# Patient Record
Sex: Female | Born: 1995 | Race: Black or African American | Hispanic: No | Marital: Single | Smoking: Never smoker
Health system: Southern US, Community
[De-identification: ages and names within clinical notes are randomized; demographics above are authoritative.]

## PROBLEM LIST (undated history)

## (undated) DIAGNOSIS — Z9109 Other allergy status, other than to drugs and biological substances: Secondary | ICD-10-CM

## (undated) DIAGNOSIS — D573 Sickle-cell trait: Secondary | ICD-10-CM

## (undated) DIAGNOSIS — J45909 Unspecified asthma, uncomplicated: Secondary | ICD-10-CM

## (undated) HISTORY — PX: FRACTURE SURGERY: SHX138

---

## 2018-04-21 ENCOUNTER — Encounter (HOSPITAL_BASED_OUTPATIENT_CLINIC_OR_DEPARTMENT_OTHER): Payer: Self-pay | Admitting: *Deleted

## 2018-04-21 ENCOUNTER — Other Ambulatory Visit: Payer: Self-pay

## 2018-04-21 ENCOUNTER — Other Ambulatory Visit: Payer: Self-pay | Admitting: Orthopedic Surgery

## 2018-04-24 NOTE — H&P (Signed)
Melissa Andrade is an 23 y.o. female.   CC / Reason for Visit: Left thumb problem HPI: This patient is a 23 year old, right-hand-dominant, student who attends [employer] who slipped and fell while on campus and while holding a drink in her left hand.  She injured her left thumb.  [facility] where she was evaluated and asked to return to her primary care physician.  She was then referred here for further evaluation and treatment and was seen by Dr. Jerl Santos yesterday where new x-rays were taken and the patient was found to have an intra-articular distal phalanx fracture of the left thumb.  She has been utilizing a thumb spica splint and has been taking over-the-counter medications.  Past Medical History:  Diagnosis Date  . Asthma   . Environmental allergies   . Sickle cell trait [facility])     Past Surgical History:  Procedure Laterality Date  . FRACTURE SURGERY      History reviewed. No pertinent family history. Social History:  reports that she has never smoked. She has never used smokeless tobacco. She reports current alcohol use. She reports that she does not use drugs.  Allergies: No Known Allergies  No medications prior to admission.    No results found for this or any previous visit (from the past 48 hour(s)). No results found.  Review of Systems  All other systems reviewed and are negative.   Height 5\' 4"  (1.626 m), weight 81.6 kg, last menstrual period 04/01/2018. Physical Exam  Constitutional:  WD, WN, NAD HEENT:  NCAT, EOMI Neuro/Psych:  Alert & oriented to person, place, and time; appropriate mood & affect Lymphatic: No generalized UE edema or lymphadenopathy Extremities / MSK:  Both UE are normal with respect to appearance, ranges of motion, joint stability, muscle strength/tone, sensation, & perfusion except as otherwise noted:  The left thumb is swollen and ecchymotic around the IP joint.  Index through small finger have full range of motion.  MP  joint of left thumb is somewhat stiff from being in a full thumb spica splint.  NVI.  Labs / X-rays:  No radiographic studies obtained today.  2 views of the left thumb ordered and obtained yesterday demonstrate a closed, intra-articular, impacted, and somewhat dorsally angulated distal phalanx fracture.  Assessment: Left thumb distal phalanx fracture  Plan:  The scope of treatment for this particular problem was discussed with the patient including surgical and nonsurgical treatment.  The patient had concerns that this may indeed result in some type of early osteoarthritis and wishes to move forward with surgical intervention of the left thumb distal phalanx fracture. The details of the operative procedure were discussed with the patient.  Questions were invited and answered.  In addition to the goal of the procedure, the risks of the procedure to include but not limited to bleeding; infection; damage to the nerves or blood vessels that could result in bleeding, numbness, weakness, chronic pain, and the need for additional procedures; stiffness; the need for revision surgery; and anesthetic risks were reviewed.  No specific outcome was guaranteed or implied.  Informed consent was obtained.   Jodi Marble, MD 04/24/2018, 2:24 PM

## 2018-04-27 ENCOUNTER — Ambulatory Visit (HOSPITAL_BASED_OUTPATIENT_CLINIC_OR_DEPARTMENT_OTHER): Payer: Medicaid Other | Admitting: Certified Registered"

## 2018-04-27 ENCOUNTER — Ambulatory Visit (HOSPITAL_COMMUNITY): Payer: Medicaid Other

## 2018-04-27 ENCOUNTER — Encounter (HOSPITAL_BASED_OUTPATIENT_CLINIC_OR_DEPARTMENT_OTHER): Payer: Self-pay | Admitting: Certified Registered"

## 2018-04-27 ENCOUNTER — Other Ambulatory Visit: Payer: Self-pay

## 2018-04-27 ENCOUNTER — Ambulatory Visit (HOSPITAL_BASED_OUTPATIENT_CLINIC_OR_DEPARTMENT_OTHER)
Admission: RE | Admit: 2018-04-27 | Discharge: 2018-04-27 | Disposition: A | Discharge status: Home / Self Care | Payer: Medicaid Other | Attending: Orthopedic Surgery | Admitting: Orthopedic Surgery

## 2018-04-27 ENCOUNTER — Encounter (HOSPITAL_BASED_OUTPATIENT_CLINIC_OR_DEPARTMENT_OTHER)
Admission: RE | Disposition: A | Discharge status: Home / Self Care | Payer: Self-pay | Source: Home / Self Care | Attending: Orthopedic Surgery

## 2018-04-27 DIAGNOSIS — Y92214 College as the place of occurrence of the external cause: Secondary | ICD-10-CM | POA: Diagnosis not present

## 2018-04-27 DIAGNOSIS — D573 Sickle-cell trait: Secondary | ICD-10-CM | POA: Insufficient documentation

## 2018-04-27 DIAGNOSIS — Z419 Encounter for procedure for purposes other than remedying health state, unspecified: Secondary | ICD-10-CM

## 2018-04-27 DIAGNOSIS — J45909 Unspecified asthma, uncomplicated: Secondary | ICD-10-CM | POA: Diagnosis not present

## 2018-04-27 DIAGNOSIS — W010XXA Fall on same level from slipping, tripping and stumbling without subsequent striking against object, initial encounter: Secondary | ICD-10-CM | POA: Diagnosis not present

## 2018-04-27 DIAGNOSIS — S62522A Displaced fracture of distal phalanx of left thumb, initial encounter for closed fracture: Secondary | ICD-10-CM | POA: Diagnosis not present

## 2018-04-27 HISTORY — DX: Sickle-cell trait: D57.3

## 2018-04-27 HISTORY — PX: OPEN REDUCTION INTERNAL FIXATION (ORIF) DISTAL PHALANX: SHX6236

## 2018-04-27 HISTORY — DX: Unspecified asthma, uncomplicated: J45.909

## 2018-04-27 HISTORY — DX: Other allergy status, other than to drugs and biological substances: Z91.09

## 2018-04-27 SURGERY — OPEN REDUCTION INTERNAL FIXATION (ORIF) DISTAL PHALANX
Anesthesia: General | Site: Thumb | Laterality: Left

## 2018-04-27 MED ORDER — CEFAZOLIN SODIUM-DEXTROSE 2-4 GM/100ML-% IV SOLN
INTRAVENOUS | Status: AC
  Filled 2018-04-27: qty 100

## 2018-04-27 MED ORDER — LIDOCAINE 2% (20 MG/ML) 5 ML SYRINGE
INTRAMUSCULAR | Status: AC
  Filled 2018-04-27: qty 20

## 2018-04-27 MED ORDER — PROPOFOL 500 MG/50ML IV EMUL
INTRAVENOUS | Status: AC
  Filled 2018-04-27: qty 150

## 2018-04-27 MED ORDER — PROPOFOL 10 MG/ML IV BOLUS
INTRAVENOUS | Status: DC | PRN
  Administered 2018-04-27: 200 mg via INTRAVENOUS

## 2018-04-27 MED ORDER — IBUPROFEN 200 MG PO TABS: 600.0000 mg | ORAL_TABLET | Freq: Four times a day (QID) | ORAL | 0 refills | Status: AC

## 2018-04-27 MED ORDER — FENTANYL CITRATE (PF) 100 MCG/2ML IJ SOLN
25.0000 ug | INTRAMUSCULAR | Status: DC | PRN
  Administered 2018-04-27: 50 ug via INTRAVENOUS

## 2018-04-27 MED ORDER — FENTANYL CITRATE (PF) 100 MCG/2ML IJ SOLN
INTRAMUSCULAR | Status: AC
  Filled 2018-04-27: qty 2

## 2018-04-27 MED ORDER — EPHEDRINE 5 MG/ML INJ
INTRAVENOUS | Status: AC
  Filled 2018-04-27: qty 20

## 2018-04-27 MED ORDER — CEFAZOLIN SODIUM-DEXTROSE 2-4 GM/100ML-% IV SOLN
2.0000 g | INTRAVENOUS | Status: AC
  Administered 2018-04-27: 2 g via INTRAVENOUS

## 2018-04-27 MED ORDER — ACETAMINOPHEN 325 MG PO TABS: 650.0000 mg | ORAL_TABLET | Freq: Four times a day (QID) | ORAL | Status: AC

## 2018-04-27 MED ORDER — BUPIVACAINE-EPINEPHRINE 0.25% -1:200000 IJ SOLN
INTRAMUSCULAR | Status: DC | PRN
  Administered 2018-04-27: 8 mL

## 2018-04-27 MED ORDER — FENTANYL CITRATE (PF) 100 MCG/2ML IJ SOLN
50.0000 ug | INTRAMUSCULAR | Status: DC | PRN
  Administered 2018-04-27: 100 ug via INTRAVENOUS

## 2018-04-27 MED ORDER — ONDANSETRON HCL 4 MG/2ML IJ SOLN
INTRAMUSCULAR | Status: AC
  Filled 2018-04-27: qty 12

## 2018-04-27 MED ORDER — MIDAZOLAM HCL 2 MG/2ML IJ SOLN
INTRAMUSCULAR | Status: AC
  Filled 2018-04-27: qty 2

## 2018-04-27 MED ORDER — LIDOCAINE HCL (CARDIAC) PF 100 MG/5ML IV SOSY
PREFILLED_SYRINGE | INTRAVENOUS | Status: DC | PRN
  Administered 2018-04-27: 80 mg via INTRAVENOUS

## 2018-04-27 MED ORDER — LACTATED RINGERS IV SOLN
INTRAVENOUS | Status: DC
  Administered 2018-04-27 (×2): via INTRAVENOUS

## 2018-04-27 MED ORDER — MIDAZOLAM HCL 2 MG/2ML IJ SOLN
1.0000 mg | INTRAMUSCULAR | Status: DC | PRN
  Administered 2018-04-27: 2 mg via INTRAVENOUS

## 2018-04-27 MED ORDER — LACTATED RINGERS IV SOLN: INTRAVENOUS | Status: DC

## 2018-04-27 MED ORDER — SCOPOLAMINE 1 MG/3DAYS TD PT72: 1.0000 | MEDICATED_PATCH | Freq: Once | TRANSDERMAL | Status: DC | PRN

## 2018-04-27 MED ORDER — DEXAMETHASONE SODIUM PHOSPHATE 10 MG/ML IJ SOLN
INTRAMUSCULAR | Status: DC | PRN
  Administered 2018-04-27: 10 mg via INTRAVENOUS

## 2018-04-27 MED ORDER — DEXAMETHASONE SODIUM PHOSPHATE 10 MG/ML IJ SOLN
INTRAMUSCULAR | Status: AC
  Filled 2018-04-27: qty 3

## 2018-04-27 MED ORDER — EPHEDRINE 5 MG/ML INJ
INTRAVENOUS | Status: AC
  Filled 2018-04-27: qty 10

## 2018-04-27 MED ORDER — OXYCODONE HCL 5 MG PO TABS: 5.0000 mg | ORAL_TABLET | Freq: Four times a day (QID) | ORAL | 0 refills | Status: AC | PRN

## 2018-04-27 SURGICAL SUPPLY — 59 items
BANDAGE COBAN STERILE 2 (GAUZE/BANDAGES/DRESSINGS) IMPLANT
BLADE MINI RND TIP GREEN BEAV (BLADE) IMPLANT
BLADE SURG 15 STRL LF DISP TIS (BLADE) ×1 IMPLANT
BLADE SURG 15 STRL SS (BLADE) ×2
BNDG COHESIVE 1X5 TAN STRL LF (GAUZE/BANDAGES/DRESSINGS) IMPLANT
BNDG COHESIVE 4X5 TAN STRL (GAUZE/BANDAGES/DRESSINGS) ×3 IMPLANT
BNDG CONFORM 2 STRL LF (GAUZE/BANDAGES/DRESSINGS) IMPLANT
BNDG ESMARK 4X9 LF (GAUZE/BANDAGES/DRESSINGS) ×3 IMPLANT
BNDG GAUZE ELAST 4 BULKY (GAUZE/BANDAGES/DRESSINGS) ×3 IMPLANT
CANISTER SUCT 1200ML W/VALVE (MISCELLANEOUS) ×3 IMPLANT
CHLORAPREP W/TINT 26ML (MISCELLANEOUS) ×3 IMPLANT
CORD BIPOLAR FORCEPS 12FT (ELECTRODE) ×3 IMPLANT
COVER BACK TABLE 60X90IN (DRAPES) ×3 IMPLANT
COVER MAYO STAND STRL (DRAPES) ×3 IMPLANT
COVER WAND RF STERILE (DRAPES) IMPLANT
CUFF TOURNIQUET SINGLE 18IN (TOURNIQUET CUFF) ×3 IMPLANT
DEPRESSOR TONGUE BLADE STERILE (MISCELLANEOUS) IMPLANT
DRAPE C-ARM 42X72 X-RAY (DRAPES) ×3 IMPLANT
DRAPE EXTREMITY T 121X128X90 (DISPOSABLE) ×3 IMPLANT
DRAPE SURG 17X23 STRL (DRAPES) ×3 IMPLANT
DRSG EMULSION OIL 3X3 NADH (GAUZE/BANDAGES/DRESSINGS) ×3 IMPLANT
GAUZE SPONGE 4X4 12PLY STRL LF (GAUZE/BANDAGES/DRESSINGS) ×3 IMPLANT
GAUZE XEROFORM 1X8 LF (GAUZE/BANDAGES/DRESSINGS) IMPLANT
GLOVE BIO SURGEON STRL SZ7.5 (GLOVE) ×3 IMPLANT
GLOVE BIOGEL PI IND STRL 7.0 (GLOVE) ×3 IMPLANT
GLOVE BIOGEL PI IND STRL 8 (GLOVE) ×1 IMPLANT
GLOVE BIOGEL PI INDICATOR 7.0 (GLOVE) ×6
GLOVE BIOGEL PI INDICATOR 8 (GLOVE) ×2
GLOVE ECLIPSE 6.5 STRL STRAW (GLOVE) ×6 IMPLANT
GOWN STRL REUS W/ TWL LRG LVL3 (GOWN DISPOSABLE) ×1 IMPLANT
GOWN STRL REUS W/ TWL XL LVL3 (GOWN DISPOSABLE) ×1 IMPLANT
GOWN STRL REUS W/TWL LRG LVL3 (GOWN DISPOSABLE) ×2
GOWN STRL REUS W/TWL XL LVL3 (GOWN DISPOSABLE) ×5 IMPLANT
K-WIRE .035X4 (WIRE) ×3 IMPLANT
K-WIRE .045X4 (WIRE) ×3 IMPLANT
NDL SAFETY ECLIPSE 18X1.5 (NEEDLE) ×2 IMPLANT
NEEDLE HYPO 18GX1.5 SHARP (NEEDLE) ×4
NEEDLE HYPO 22GX1.5 SAFETY (NEEDLE) ×3 IMPLANT
NS IRRIG 1000ML POUR BTL (IV SOLUTION) ×3 IMPLANT
PACK BASIN DAY SURGERY FS (CUSTOM PROCEDURE TRAY) ×3 IMPLANT
PADDING CAST ABS 4INX4YD NS (CAST SUPPLIES)
PADDING CAST ABS COTTON 4X4 ST (CAST SUPPLIES) IMPLANT
RUBBERBAND STERILE (MISCELLANEOUS) IMPLANT
SPLINT PLASTER CAST XFAST 3X15 (CAST SUPPLIES) ×8 IMPLANT
SPLINT PLASTER XTRA FASTSET 3X (CAST SUPPLIES) ×16
STOCKINETTE 6  STRL (DRAPES) ×2
STOCKINETTE 6 STRL (DRAPES) ×1 IMPLANT
SUCTION FRAZIER HANDLE 10FR (MISCELLANEOUS) ×2
SUCTION TUBE FRAZIER 10FR DISP (MISCELLANEOUS) ×1 IMPLANT
SUT CHROMIC 6 0 PS 4 (SUTURE) IMPLANT
SUT ETHILON 4 0 PS 2 18 (SUTURE) IMPLANT
SUT VICRYL RAPIDE 4-0 (SUTURE) IMPLANT
SUT VICRYL RAPIDE 4/0 PS 2 (SUTURE) ×3 IMPLANT
SYR 10ML LL (SYRINGE) ×3 IMPLANT
SYR BULB 3OZ (MISCELLANEOUS) ×3 IMPLANT
TOWEL GREEN STERILE FF (TOWEL DISPOSABLE) ×3 IMPLANT
TUBE CONNECTING 20'X1/4 (TUBING) ×1
TUBE CONNECTING 20X1/4 (TUBING) ×2 IMPLANT
UNDERPAD 30X30 (UNDERPADS AND DIAPERS) IMPLANT

## 2018-04-27 NOTE — Transfer of Care (Signed)
Immediate Anesthesia Transfer of Care Note  Patient: Contrina Harkin  Procedure(s) Performed: OPEN TREATMENT OF LEFT THUMB DISTAL PHALANX FRACTURE WITH PINNING (Left Thumb)  Patient Location: PACU  Anesthesia Type:General  Level of Consciousness: drowsy and patient cooperative  Airway & Oxygen Therapy: Patient Spontanous Breathing and Patient connected to face mask oxygen  Post-op Assessment: Report given to RN and Post -op Vital signs reviewed and stable  Post vital signs: Reviewed and stable  Last Vitals:  Vitals Value Taken Time  BP    Temp    Pulse 74 04/27/2018 10:08 AM  Resp    SpO2 99 % 04/27/2018 10:08 AM  Vitals shown include unvalidated device data.  Last Pain:  Vitals:   04/27/18 0857  TempSrc: Oral  PainSc: 0-No pain         Complications: No apparent anesthesia complications

## 2018-04-27 NOTE — Discharge Instructions (Addendum)
Discharge Instructions   You have a dressing with a plaster splint incorporated in it. Move your fingers as much as possible, making a full fist and fully opening the fist. Elevate your hand to reduce pain & swelling of the digits.  Ice over the operative site may be helpful to reduce pain & swelling.  DO NOT USE HEAT. Pain medicine has been prescribed for you.  Take Ibuprofen 600 mg and Tylenol 650 mg every 6 hours together. Take the pain medicine prescribed additionally as a rescue medicine for severe post operative pain. Leave the dressing in place until you return to our office.  You may shower, but keep the bandage clean & dry.  You may drive a car when you are off of prescription pain medications and can safely control your vehicle with both hands. Call our office and schedule an appointment for 7-10 days from the date of surgery.   Please call [phone] during normal business hours or [phone] after hours for any problems. Including the following:  - excessive redness of the incisions - drainage for more than 4 days - fever of more than 101.5 F  *Please note that pain medications will not be refilled after hours or on weekends.   Post Anesthesia Home Care Instructions  Activity: Get plenty of rest for the remainder of the day. A responsible individual must stay with you for 24 hours following the procedure.  For the next 24 hours, DO NOT: -Drive a car -Advertising copywriter -Drink alcoholic beverages -Take any medication unless instructed by your physician -Make any legal decisions or sign important papers.  Meals: Start with liquid foods such as gelatin or soup. Progress to regular foods as tolerated. Avoid greasy, spicy, heavy foods. If nausea and/or vomiting occur, drink only clear liquids until the nausea and/or vomiting subsides. Call your physician if vomiting continues.  Special Instructions/Symptoms: Your throat may feel dry or sore from the anesthesia or the  breathing tube placed in your throat during surgery. If this causes discomfort, gargle with warm salt water. The discomfort should disappear within 24 hours.  If you had a scopolamine patch placed behind your ear for the management of post- operative nausea and/or vomiting:  1. The medication in the patch is effective for 72 hours, after which it should be removed.  Wrap patch in a tissue and discard in the trash. Wash hands thoroughly with soap and water. 2. You may remove the patch earlier than 72 hours if you experience unpleasant side effects which may include dry mouth, dizziness or visual disturbances. 3. Avoid touching the patch. Wash your hands with soap and water after contact with the patch.

## 2018-04-27 NOTE — Anesthesia Procedure Notes (Signed)
Procedure Name: LMA Insertion Date/Time: 04/27/2018 9:18 AM Performed by: Endia Moncur D, CRNA Pre-anesthesia Checklist: Patient identified, Emergency Drugs available, Suction available and Patient being monitored Patient Re-evaluated:Patient Re-evaluated prior to induction Oxygen Delivery Method: Circle system utilized Preoxygenation: Pre-oxygenation with 100% oxygen Induction Type: IV induction Ventilation: Mask ventilation without difficulty LMA: LMA inserted LMA Size: 4.0 Number of attempts: 1 Airway Equipment and Method: Bite block Placement Confirmation: positive ETCO2 Tube secured with: Tape Dental Injury: Teeth and Oropharynx as per pre-operative assessment        

## 2018-04-27 NOTE — Anesthesia Procedure Notes (Signed)
Procedure Name: LMA Insertion Date/Time: 04/27/2018 9:18 AM Performed by: Sheryn BisonBlocker, Teletha Petrea D, CRNA Pre-anesthesia Checklist: Patient identified, Emergency Drugs available, Suction available and Patient being monitored Patient Re-evaluated:Patient Re-evaluated prior to induction Oxygen Delivery Method: Circle system utilized Preoxygenation: Pre-oxygenation with 100% oxygen Induction Type: IV induction Ventilation: Mask ventilation without difficulty LMA: LMA inserted LMA Size: 4.0 Number of attempts: 1 Airway Equipment and Method: Bite block Placement Confirmation: positive ETCO2 Tube secured with: Tape Dental Injury: Teeth and Oropharynx as per pre-operative assessment

## 2018-04-27 NOTE — Op Note (Signed)
04/27/2018  8:45 AM  PATIENT:  Melissa Andrade  22 y.o. female  PRE-OPERATIVE DIAGNOSIS:  Left thumb P2 displaced  intra-articular fx  POST-OPERATIVE DIAGNOSIS:  Same  PROCEDURE:  ORIF L thumb P2 intra-articular fx  SURGEON: Cliffton Astersavid A. Janee Mornhompson, MD  PHYSICIAN ASSISTANT: Danielle RankinKirsten Schrader, OPA-C  ANESTHESIA:  general  SPECIMENS:  None  DRAINS:   None  EBL:  less than 50 mL  PREOPERATIVE INDICATIONS:  Melissa EmeryJoynazia Heber is a  23 y.o. female with a displaced left thumb P2 intra-articular fx  The risks benefits and alternatives were discussed with the patient preoperatively including but not limited to the risks of infection, bleeding, nerve injury, cardiopulmonary complications, the need for revision surgery, among others, and the patient verbalized understanding and consented to proceed.  OPERATIVE IMPLANTS: 0.045 inch and 0.035 inch kwires  OPERATIVE PROCEDURE:  After receiving prophylactic antibiotics, the patient was escorted to the operative theatre and placed in a supine position. General anesthesia was administered.  A surgical "time-out" was performed during which the planned procedure, proposed operative site, and the correct patient identity were compared to the operative consent and agreement confirmed by the circulating nurse according to current facility policy.  Following application of a tourniquet to the operative extremity, the exposed skin was prepped with Chloraprep and draped in the usual sterile fashion.  A digital block was performed by me.  The limb was exsanguinated with an Esmarch bandage and the tourniquet inflated to approximately 100mmHg higher than systolic BP.  An H shaped incision was fashioned over the dorsal aspect of the IP joint of the thumb.  Proximal distal flaps were developed and elevated.  The displaced fracture fragment was actually 2.  They were flipped back to allow access to the joint.  Clot and debris was removed.  The bed was cleaned.  The  fragments were placed back into its native position and could be considered well reduced.  A 0.045 inch K wire was then driven from the proximal surface of P2 out the tip of the thumb.  The fragments were then reduced and held reduced as a K wire was advanced through the fragment into the head of the proximal phalanx.  Additionally, a 0.035 inch K wire was then driven obliquely through the distal ST flap, helping to keep the fracture fragments from displacing, and was driven into the head of the proximal phalanx, from dorsal distal to proximal volar.  2 of these were placed, one for the medial fragment and one for the lateral.   Final images were obtained.  The wound was irrigated, the tourniquet released and some additional hemostasis obtained with bipolar electrocautery.  The skin was closed with 4-0 Vicryl Rapide sutures and a bulky splint dressing was applied.  She was awakened and taken recovery in stable condition, breathing spontaneously.  DISPOSITION: She will be discharged home today with double instructions, returning in 7 to 10 days, with new x-rays of the left thumb out of splint and conversion to a hand-based thumb spica cast.

## 2018-04-27 NOTE — Interval H&P Note (Signed)
History and Physical Interval Note:  04/27/2018 8:45 AM  Melissa Andrade  has presented today for surgery, with the diagnosis of Left thumb distal phalanx fracture  The various methods of treatment have been discussed with the patient and family. After consideration of risks, benefits and other options for treatment, the patient has consented to  Procedure(s): OPEN TREATMENT OF LEFT THUMB DISTAL PHALANX FRACTURE (Left) as a surgical intervention .  The patient's history has been reviewed, patient examined, no change in status, stable for surgery.  I have reviewed the patient's chart and labs.  Questions were answered to the patient's satisfaction.     Jodi Marbleavid A Queena Monrreal

## 2018-04-27 NOTE — Anesthesia Preprocedure Evaluation (Addendum)
Anesthesia Evaluation  Patient identified by MRN, date of birth, ID band Patient awake    Reviewed: Allergy & Precautions, NPO status , Patient's Chart, lab work & pertinent test results  Airway Mallampati: I  TM Distance: >3 FB Neck ROM: Full    Dental no notable dental hx. (+) Teeth Intact, Dental Advisory Given   Pulmonary asthma ,    Pulmonary exam normal breath sounds clear to auscultation       Cardiovascular negative cardio ROS Normal cardiovascular exam Rhythm:Regular Rate:Normal     Neuro/Psych negative neurological ROS  negative psych ROS   GI/Hepatic negative GI ROS, Neg liver ROS,   Endo/Other  negative endocrine ROS  Renal/GU negative Renal ROS  negative genitourinary   Musculoskeletal negative musculoskeletal ROS (+)   Abdominal   Peds  Hematology negative hematology ROS (+) Sickle cell trait ,   Anesthesia Other Findings   Reproductive/Obstetrics                            Anesthesia Physical Anesthesia Plan  ASA: II  Anesthesia Plan: General   Post-op Pain Management:    Induction: Intravenous  PONV Risk Score and Plan: 3 and Ondansetron, Dexamethasone and Midazolam  Airway Management Planned: LMA  Additional Equipment:   Intra-op Plan:   Post-operative Plan: Extubation in OR  Informed Consent: I have reviewed the patients History and Physical, chart, labs and discussed the procedure including the risks, benefits and alternatives for the proposed anesthesia with the patient or authorized representative who has indicated his/her understanding and acceptance.     Dental advisory given  Plan Discussed with: CRNA  Anesthesia Plan Comments:         Anesthesia Quick Evaluation

## 2018-04-27 NOTE — Anesthesia Postprocedure Evaluation (Signed)
Anesthesia Post Note  Patient: Cherisa Littlepage  Procedure(s) Performed: OPEN TREATMENT OF LEFT THUMB DISTAL PHALANX FRACTURE WITH PINNING (Left Thumb)     Patient location during evaluation: PACU Anesthesia Type: General Level of consciousness: awake and alert Pain management: pain level controlled Vital Signs Assessment: post-procedure vital signs reviewed and stable Respiratory status: spontaneous breathing, nonlabored ventilation, respiratory function stable and patient connected to nasal cannula oxygen Cardiovascular status: blood pressure returned to baseline and stable Postop Assessment: no apparent nausea or vomiting Anesthetic complications: no    Last Vitals:  Vitals:   04/27/18 1045 04/27/18 1100  BP: 99/61 111/76  Pulse: 78 69  Resp: (!) 22 18  Temp:  36.5 C  SpO2: 99% 100%    Last Pain:  Vitals:   04/27/18 1100  TempSrc:   PainSc: 0-No pain                 Jadan Rouillard L Alexsia Klindt

## 2018-04-28 ENCOUNTER — Encounter (HOSPITAL_BASED_OUTPATIENT_CLINIC_OR_DEPARTMENT_OTHER): Payer: Self-pay | Admitting: Orthopedic Surgery

## 2018-06-09 ENCOUNTER — Encounter: Payer: Self-pay | Admitting: *Deleted

## 2018-06-09 ENCOUNTER — Other Ambulatory Visit: Payer: Self-pay

## 2018-06-09 ENCOUNTER — Ambulatory Visit: Payer: Medicaid Other | Attending: Orthopedic Surgery | Admitting: *Deleted

## 2018-06-09 DIAGNOSIS — M6281 Muscle weakness (generalized): Secondary | ICD-10-CM | POA: Insufficient documentation

## 2018-06-09 DIAGNOSIS — R278 Other lack of coordination: Secondary | ICD-10-CM | POA: Diagnosis present

## 2018-06-09 DIAGNOSIS — M25542 Pain in joints of left hand: Secondary | ICD-10-CM | POA: Diagnosis not present

## 2018-06-09 DIAGNOSIS — M25642 Stiffness of left hand, not elsewhere classified: Secondary | ICD-10-CM | POA: Diagnosis present

## 2018-06-09 DIAGNOSIS — M79642 Pain in left hand: Secondary | ICD-10-CM | POA: Diagnosis present

## 2018-06-09 NOTE — Therapy (Signed)
[facility] [address] [place], [place], [address] Phone: [phone]   Fax:  [phone]  Occupational Therapy Evaluation  Patient Details  Name: Melissa Andrade MRN: [id number] Date of Birth: February 28, 1996 Referring Provider (OT): Dr. Janee Morn   Encounter Date: 06/09/2018  OT End of Session - 06/09/18 0954    Visit Number  1    Number of Visits  13   MCD requesting Eval + 12 additional visits   Date for OT Re-Evaluation  09/01/18    Authorization Type  Need approval from [facility] - requesting Eval + 12 additional visits    Authorization - Visit Number  --   Eval only   OT Start Time  0802    OT Stop Time  0918    OT Time Calculation (min)  76 min    Activity Tolerance  Patient tolerated treatment well    Behavior During Therapy  [facility] for tasks assessed/performed       Past Medical History:  Diagnosis Date  . Asthma   . Environmental allergies   . Sickle cell trait [facility])     Past Surgical History:  Procedure Laterality Date  . FRACTURE SURGERY    . OPEN REDUCTION INTERNAL FIXATION (ORIF) DISTAL PHALANX Left 04/27/2018   Procedure: OPEN TREATMENT OF LEFT THUMB DISTAL PHALANX FRACTURE WITH PINNING;  Surgeon: Mack Hook, MD;  Location: [facility];  Service: Orthopedics;  Laterality: Left;    There were no vitals filed for this visit.  Subjective Assessment - 06/09/18 0808    Subjective   Pt presents with fracture of L IP Joint. Pt is L hand dominant.    Pertinent History  Asthma,    Currently in Pain?  No/denies    Multiple Pain Sites  No        [facility] OT Assessment - 06/09/18 0001      Assessment   Medical Diagnosis  L Thumb distal phalanx fracture s/p ORIF    Referring Provider (OT)  Dr. 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      Written Expression   Dominant Hand  Left    Handwriting  --   Pt is LHD currently writing with R hand     Cognition   Overall Cognitive Status  Within Functional Limits for tasks assessed      Observation/Other Assessments   Observations  Pt presents with hand based cast on L hand that is able to be removed in clinic for splinting. Pt has mild edema noted at L thumb/IP joint. She has no drainage from  wound along dorsal L thumb IP. She is with quite a bit of stiffness of her first webspace is noted to be unable to slightly rotate and ABD her thumb during splinting secondary to 1st web space stiffness. For this reason, splint may need to be serially adjusted to allow for increased first web sapce/functional positioning.      Sensation   Light Touch  Appears Intact      Coordination   Fine Motor Movements are Fluid and Coordinated  No   Impaired Coordinaiton/No A/ROM to DIP until cleared by MD     Edema - Water Displacement (mL)   Left Hand  --   Left thumb IP joint Min/Mild edema observed visually      ROM / Strength   AROM / PROM / Strength  AROM   NT at this time     AROM   Overall AROM   Deficits   Unable to assess IP L thumb   Overall AROM Comments  L thumb impaired for active/passive ROM      Hand Function   Left Hand Grip (lbs)  Unable to assess    Left Hand Lateral Pinch  --   unable to assess   Left 3  point pinch  --   Unable to assess              OT Treatments/Exercises (OP) - 06/09/18 0001      Splinting   Splinting  Pt presents to clinic today for protective L thumb spica splinting following ORIF thumb distal phalanx fracture on 04/27/2018. Pt protective hand based cast was removed in clinic and her hand was cleaned while maintaining protected position of L thumb IP extension. Stockinette was applied and a custom hand based thumb based thumb spica was fabricated placing her thumb in slight rotation and ABD. The IP is included in this splint as per MD orders. Pt was with noted stiffness of her first web space and her splint may need to be serially adjusted to allow for a position of functional (ABD/rotation of thumb)secondary to web space stiffness. Pt was educated written and verbally in splint use, care and precautions & a handout was issued/reviewed. Pt was then instruced in A/ROM for L first web space stretches as well as ABD and rotation of thumb & MP flexion/extension all while maintaining thumb IP extension. Pt verbalized understanding of this in clinic today. She will begin A/ROM IP joint when clarified by Dr Janee Morn. She is currently 6 weeks and 1 day post-op.       WEARING SCHEDULE:  Wear splint at ALL times except for hygiene care (May remove splint for exercises and then immediately place back on ONLY if directed by the therapist). Keep the thumb tip joint straight at all times when the splint is off.  PURPOSE:  To prevent movement and for protection until injury can heal  CARE OF SPLINT:  Keep splint away from heat sources including: stove, radiator or furnace, or a car in sunlight. The splint can melt and will no longer fit you properly  Keep away from pets and children  Clean the splint with rubbing alcohol or cool water and soap - dry completely before reapplying.  * During this time, make sure you also clean your hand/arm as instructed by your therapist and/or  perform dressing changes as needed. Then dry hand/arm completely before replacing splint. (When cleaning hand/arm, keep it immobilized in same position until splint is replaced)  PRECAUTIONS/POTENTIAL PROBLEMS: *If you  notice or experience increased pain, swelling, numbness, or a lingering reddened area from the splint: Contact your therapist immediately by calling [phone]. You must wear the splint for protection, but we will get you scheduled for adjustments as quickly as possible.  (If only straps or hooks need to be replaced and NO adjustments to the splint need to be made, just call the office ahead and let them know you are coming in)  If you have any medical concerns or signs of infection, please call your doctor immediately      OT Education - 06/09/18 0950    Education Details  Splint use, care and precautions L hand/thumb, gentle active thumb MP flexion/exten, First web space stretches/A/ROM all while maintaining IP extension until cleared by Dr Janee Mornhompson.    Person(s) Educated  Patient    Methods  Explanation;Verbal cues;Handout    Comprehension  Verbalized understanding;Returned demonstration       OT Short Term Goals - 06/09/18 1009      OT SHORT TERM GOAL #1   Title  Pt wil lbe Mod I splinting use, care and precautions L hand/thumb    Baseline  Req Min A for don/doff and positioning    Time  3    Period  Weeks    Status  New    Target Date  06/30/18      OT SHORT TERM GOAL #2   Title  Pt will be Mod I home program L thumb    Baseline  Req Min VC's and TC's for performance and positioning    Time  3    Period  Weeks    Status  New    Target Date  06/30/18        OT Long Term Goals - 06/09/18 1011      OT LONG TERM GOAL #1   Title  Pt will be Mod I upgraded HEP L thumb    Baseline  depoendent    Time  6    Period  Weeks    Status  New    Target Date  07/21/18      OT LONG TERM GOAL #2   Title  Pt will be Mod I scar management L thumb    Baseline   Dependent    Time  6    Period  Weeks    Status  New    Target Date  07/21/18      OT LONG TERM GOAL #3   Title  Pt will demonstrate ability to perform opposition and fine motor tasks involving L dominant thumb (ie: Mod I writing/signing her nam, perofrm ADL involving tip to tip prehension at Mod I level)    Baseline  dependent    Time  6    Period  Weeks    Status  New    Target Date  07/21/18      OT LONG TERM GOAL #4   Title  Pt will demonstrate functional grip L hand of 10-15# or greater    Baseline  Unable    Time  6    Period  Weeks    Status  New    Target Date  07/21/18      OT LONG TERM GOAL #5   Title  Pt will demonstrate functional three point and lateral pinch for ADL activites (holding tooth brush, styling hair, holding utensils etc).    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 She presents to clinic today for protective L thumb spica splinting following ORIF thumb distal phalanx fracture on 04/27/2018 by Dr Janee Morn. Pt protective hand based cast was removed in clinic and her hand was cleaned while maintaining protected position of L thumb IP extension. Stockinette was applied and a custom hand based thumb based thumb spica was fabricated placing her thumb in slight ABD and rotation. The IP is included in this splint as per MD orders. Pt was with noted significant stiffness of her first web space and her splint may need to be serially adjusted to allow for a position of function (ABD/rotation of thumb) secondary to significant web space stiffness. Pt was educated written and verbally in splint use, care and precautions & a handout was issued/reviewed. Pt was then instruced in A/ROM  for L first web space stretches as well as ABD and rotation of thumb & MP flexion/extension all while maintaining thumb IP extension. Pt verbalized understanding of this in clinic today. She will begin A/ROM IP joint when clarified by Dr Janee Morn. She is currently 6 weeks and 1 day post-op.    OT Occupational Profile and History  Problem Focused Assessment - Including review of records relating to presenting problem    Occupational performance deficits (Please refer to evaluation for details):  ADL's;IADL's;Work    Body Structure / Function / Physical Skills  ADL;Dexterity;Flexibility;ROM;Strength;Coordination;Edema;FMC;Scar mobility;Wound;Pain;UE functional use;Skin integrity    Rehab Potential  Good    Clinical Decision Making  Several treatment options, min-mod task modification necessary    Comorbidities Affecting Occupational Performance:  None    Modification or Assistance to Complete Evaluation   No modification of tasks or assist necessary to complete eval    OT Frequency  2x / week    OT Duration  6 weeks    OT Treatment/Interventions  Self-care/ADL training;Paraffin;Therapeutic exercise;Ultrasound;Fluidtherapy;Scar mobilization;Splinting;Patient/family education;Therapeutic activities;Passive range of motion;Moist Heat    Plan  Splint check and adjustment. Upgrade HEP as clarified by Dr Janee Morn (need clarification of when can AROM L thumb IP and how ling does pt require protective splinting).    Consulted and Agree with Plan of Care  Patient       Patient will benefit from skilled therapeutic intervention in order to improve the following deficits and impairments:  Body Structure / Function / Physical Skills  Visit Diagnosis: Pain in joint of left hand - Plan: Ot plan of care cert/re-cert  Pain in left hand - Plan: Ot plan of care cert/re-cert  Other lack of coordination - Plan: Ot plan of care cert/re-cert  Muscle weakness (generalized) - Plan: Ot plan of care  cert/re-cert  Stiffness of left hand, not elsewhere classified - Plan: Ot plan of care cert/re-cert    Problem List There are no active problems to display for this patient.   Charletta Cousin, Mahum Betten Beth Dixon, OTR/L 06/09/2018, 12:00 PM  [facility] [address] [place], [place], [address] Phone: [phone]   Fax:  [phone]  Name: Melissa Andrade MRN: [id number] Date of Birth: 1995/08/15

## 2018-06-09 NOTE — Patient Instructions (Signed)
WEARING SCHEDULE:  Wear splint at ALL times except for hygiene care (May remove splint for exercises and then immediately place back on ONLY if directed by the therapist). Keep the thumb tip joint straight at all times when the splint is off.  PURPOSE:  To prevent movement and for protection until injury can heal  CARE OF SPLINT:  Keep splint away from heat sources including: stove, radiator or furnace, or a car in sunlight. The splint can melt and will no longer fit you properly  Keep away from pets and children  Clean the splint with rubbing alcohol or cool water and soap - dry completely before reapplying.  * During this time, make sure you also clean your hand/arm as instructed by your therapist and/or perform dressing changes as needed. Then dry hand/arm completely before replacing splint. (When cleaning hand/arm, keep it immobilized in same position until splint is replaced)  PRECAUTIONS/POTENTIAL PROBLEMS: *If you notice or experience increased pain, swelling, numbness, or a lingering reddened area from the splint: Contact your therapist immediately by calling [phone]. You must wear the splint for protection, but we will get you scheduled for adjustments as quickly as possible.  (If only straps or hooks need to be replaced and NO adjustments to the splint need to be made, just call the office ahead and let them know you are coming in)  If you have any medical concerns or signs of infection, please call your doctor immediately

## 2018-06-16 ENCOUNTER — Ambulatory Visit: Payer: Medicaid Other | Admitting: Occupational Therapy

## 2018-06-17 ENCOUNTER — Ambulatory Visit: Payer: Medicaid Other | Admitting: Occupational Therapy

## 2018-06-17 ENCOUNTER — Ambulatory Visit: Payer: Medicaid Other | Admitting: *Deleted

## 2018-06-17 DIAGNOSIS — M79642 Pain in left hand: Secondary | ICD-10-CM

## 2018-06-17 DIAGNOSIS — M25542 Pain in joints of left hand: Secondary | ICD-10-CM | POA: Diagnosis not present

## 2018-06-17 DIAGNOSIS — M25642 Stiffness of left hand, not elsewhere classified: Secondary | ICD-10-CM

## 2018-06-17 DIAGNOSIS — M6281 Muscle weakness (generalized): Secondary | ICD-10-CM

## 2018-06-17 NOTE — Patient Instructions (Signed)
   MP Flexion (Active)   Bend thumb to touch base of little finger, keeping tip joint straight. Repeat __10-15__ times. Do _6___ sessions per day.   Composite Extension (Active)   Bring thumb up and out in hitchhiker position.  Repeat __10-15__ times. Do _6___ sessions per day.   Palmar Adduction/Abduction (Active)    Move thumb down, away from palm. Move back to rest along palm. Repeat _10-15___ times. Do __6__ sessions per day.    IP Flexion (Active Blocked)   Brace thumb below tip joint. Bend joint as far as possible and hold, then gently push further w/ other hand and hold new position. Then straighten back up all the way between each  Repeat __10-15__ times. Do _6___ sessions per day.

## 2018-06-17 NOTE — Therapy (Signed)
[facility] [address] [place], [place], [address] Phone: [phone]   Fax:  [phone]  Occupational Therapy Treatment  Patient Details  Name: Melissa Andrade MRN: [id number] Date of Birth: September 26, 1995 Referring Provider (OT): Dr. Janee Morn   Encounter Date: 06/17/2018  OT End of Session - 06/17/18 1223    Visit Number  2    Number of Visits  13    Date for OT Re-Evaluation  09/01/18    Authorization Type  MCD approved 7 visits from 3/18 - 07/08/18    Authorization - Visit Number  1    Authorization - Number of Visits  7    OT Start Time  1015    OT Stop Time  1100    OT Time Calculation (min)  45 min    Activity Tolerance  Patient tolerated treatment well    Behavior During Therapy  [facility] for tasks assessed/performed       Past Medical History:  Diagnosis Date  . Asthma   . Environmental allergies   . Sickle cell trait [facility])     Past Surgical History:  Procedure Laterality Date  . FRACTURE SURGERY    . OPEN REDUCTION INTERNAL FIXATION (ORIF) DISTAL PHALANX Left 04/27/2018   Procedure: OPEN TREATMENT OF LEFT THUMB DISTAL PHALANX FRACTURE WITH PINNING;  Surgeon: Mack Hook, MD;  Location: [facility];  Service: Orthopedics;  Laterality: Left;    There were no vitals filed for this visit.  Subjective Assessment - 06/17/18 1021    Subjective   The splint is bothering me a little (at webspace)     Pertinent History  Lt thumb distal phalanx fx s/p ORIF 04/27/18. PMH: Asthma    Limitations  splint on b/t exercises, no strengthening yet    Currently in Pain?  No/denies                   OT Treatments/Exercises (OP) - 06/17/18 0001      Exercises   Exercises  Hand      Hand Exercises   Other Hand Exercises  Issued ROM HEP for Lt thumb as pt is > 7 weeks post-op. Pt very stiff at thumb IP joint as expected, but also noted difficulty w/ thumb radial abduction. Pt performed each  as instructed - see pt instructions for details     Splinting   Splinting  Adjusted splint at webspace by adding more padding. Provided pt w/ 2 new compression finger stockinette. Reviewed hand hygiene and importance of letting suture area dry completely before reapplying splint             OT Education - 06/17/18 1038    Education Details  ROM HEP for Lt thumb    Person(s) Educated  Patient    Methods  Explanation;Demonstration;Handout    Comprehension  Verbalized understanding;Returned demonstration       OT Short Term Goals - 06/09/18 1009      OT SHORT TERM GOAL #1   Title  Pt wil lbe Mod I splinting use, care and precautions L hand/thumb    Baseline  Req Min A for don/doff and positioning    Time  3    Period  Weeks    Status  New    Target Date  06/30/18      OT SHORT TERM GOAL #2   Title  Pt will be Mod I home program L thumb    Baseline  Req Min VC's and  TC's for performance and positioning    Time  3    Period  Weeks    Status  New    Target Date  06/30/18        OT Long Term Goals - 06/09/18 1011      OT LONG TERM GOAL #1   Title  Pt will be Mod I upgraded HEP L thumb    Baseline  depoendent    Time  6    Period  Weeks    Status  New    Target Date  07/21/18      OT LONG TERM GOAL #2   Title  Pt will be Mod I scar management L thumb    Baseline  Dependent    Time  6    Period  Weeks    Status  New    Target Date  07/21/18      OT LONG TERM GOAL #3   Title  Pt will demonstrate ability to perform opposition and fine motor tasks involving L dominant thumb (ie: Mod I writing/signing her nam, perofrm ADL involving tip to tip prehension at Mod I level)    Baseline  dependent    Time  6    Period  Weeks    Status  New    Target Date  07/21/18      OT LONG TERM GOAL #4   Title  Pt will demonstrate functional grip L hand of 10-15# or greater    Baseline  Unable    Time  6    Period  Weeks    Status  New    Target Date  07/21/18      OT LONG  TERM GOAL #5   Title  Pt will demonstrate functional three point and lateral pinch for ADL activites (holding tooth brush, styling hair, holding utensils etc).    Baseline  Unable    Time  6    Period  Weeks    Status  New    Target Date  07/21/18            Plan - 06/17/18 1224    Clinical Impression Statement  Pt progressing with ROM however very stiff at IP joint of thumb and difficulty with radial abduction    Occupational performance deficits (Please refer to evaluation for details):  ADL's;IADL's;Work    Body Structure / Function / Physical Skills  ADL;Dexterity;Flexibility;ROM;Strength;Coordination;Edema;FMC;Scar mobility;Wound;Pain;UE functional use;Skin integrity    Rehab Potential  Good    OT Frequency  2x / week    OT Duration  6 weeks    OT Treatment/Interventions  Self-care/ADL training;Paraffin;Therapeutic exercise;Ultrasound;Fluidtherapy;Scar mobilization;Splinting;Patient/family education;Therapeutic activities;Passive range of motion;Moist Heat    Plan   P/ROM for IP joint, continue A/ROM and place and hold ex's    Consulted and Agree with Plan of Care  Patient       Patient will benefit from skilled therapeutic intervention in order to improve the following deficits and impairments:  Body Structure / Function / Physical Skills  Visit Diagnosis: Pain in joint of left hand  Pain in left hand  Stiffness of left hand, not elsewhere classified  Muscle weakness (generalized)    Problem List There are no active problems to display for this patient.   Kelli Churn, OTR/L 06/17/2018, 12:26 PM  [facility] [address] [place], [place], [address] Phone: [phone]   Fax:  [phone]  Name: Kaysen Zacarias MRN: [id number] Date of Birth:  07/19/1995 

## 2018-06-22 ENCOUNTER — Telehealth: Payer: Self-pay

## 2018-06-22 NOTE — Telephone Encounter (Signed)
Occupational therapist called this patient and left message re: clinic closing for 2 weeks. Encouraged patient to continue exercises. Therapist will attempt to call patient back to ask if pt is interested in virtual telehealth visits if able.  Jene Every, OTR/L

## 2018-06-23 ENCOUNTER — Ambulatory Visit: Payer: Medicaid Other | Admitting: Occupational Therapy

## 2018-06-23 ENCOUNTER — Telehealth: Payer: Self-pay

## 2018-06-23 NOTE — Telephone Encounter (Signed)
Occupational therapy called back today and was able to speak directly with [place] re: outpatient clinic having limited hours due to CoVid19. Verbally reviewed exercises to continue. Pt also interested in telehealth or virtual visits if these become available. Pt given phone number of clinic and reduced office hours available to call if she had urgent therapy needs.

## 2018-06-30 ENCOUNTER — Encounter: Payer: Medicaid Other | Admitting: Occupational Therapy

## 2018-07-03 ENCOUNTER — Telehealth: Payer: Self-pay | Admitting: Physical Therapy

## 2018-07-03 NOTE — Telephone Encounter (Signed)
Melissa Andrade was contacted today regarding temporary reduction of [facility]patient [facility] due to concerns for community transmission of COVID-19.  Patient identity was verified.  Assessed if patient needed to be seen in person by clinician (recent fall or acute injury that requires hands on assessment and advice, change in diet order, post-surgical, special cases, etc.).    Patient did not have an acute/special need that requires in person visit. Proceeded with phone call.  Therapist advised the patient to continue to perform her HEP and assured she had no unanswered questions or concerns at this time.  Pt did report that she had been to see her physician and he wanted her to continue to work on thumb flexion.  Will alert primary OT.   The patient expressed interest in being contacted for an E-Visit, virtual check in, or Telehealth visit to continue their plan of care, when those services become available.  [facility] will follow up with patient at that time.   Patient is aware we can be reached by telephone during limited business hours in the meantime.  Dierdre Highman, PT, DPT 07/03/18    3:32 PM

## 2018-07-14 ENCOUNTER — Encounter: Payer: Medicaid Other | Admitting: Occupational Therapy

## 2018-07-14 ENCOUNTER — Ambulatory Visit: Payer: Medicaid Other | Admitting: Occupational Therapy

## 2018-07-21 ENCOUNTER — Ambulatory Visit: Payer: Medicaid Other | Attending: Orthopedic Surgery | Admitting: Occupational Therapy

## 2018-07-21 ENCOUNTER — Encounter: Payer: Medicaid Other | Admitting: Occupational Therapy

## 2018-07-21 DIAGNOSIS — M25642 Stiffness of left hand, not elsewhere classified: Secondary | ICD-10-CM | POA: Diagnosis present

## 2018-07-21 DIAGNOSIS — M79642 Pain in left hand: Secondary | ICD-10-CM

## 2018-07-21 DIAGNOSIS — M25542 Pain in joints of left hand: Secondary | ICD-10-CM

## 2018-07-21 DIAGNOSIS — R278 Other lack of coordination: Secondary | ICD-10-CM

## 2018-07-21 DIAGNOSIS — M6281 Muscle weakness (generalized): Secondary | ICD-10-CM | POA: Diagnosis present

## 2018-07-21 NOTE — Therapy (Signed)
[facility] [address] [place], [place], [address] Phone: [phone]   Fax:  [phone]  Occupational Therapy Treatment  Patient Details  Name: Melissa Andrade MRN: [id number] Date of Birth: 06-25-1995 Referring Provider (OT): Dr. Janee Morn   Encounter Date: 07/21/2018  OT End of Session - 07/21/18 1625    Visit Number  3    Number of Visits  13    Date for OT Re-Evaluation  09/01/18    Authorization Type  MCD approved 7 visits from 3/18 - 07/08/18 , approved 6 visits  4/16-5/27/20    Authorization - Visit Number  2    Authorization - Number of Visits  7    OT Start Time  1305    OT Stop Time  1345    OT Time Calculation (min)  40 min    Activity Tolerance  Patient tolerated treatment well    Behavior During Therapy  [facility] for tasks assessed/performed       Past Medical History:  Diagnosis Date  . Asthma   . Environmental allergies   . Sickle cell trait [facility])     Past Surgical History:  Procedure Laterality Date  . FRACTURE SURGERY    . OPEN REDUCTION INTERNAL FIXATION (ORIF) DISTAL PHALANX Left 04/27/2018   Procedure: OPEN TREATMENT OF LEFT THUMB DISTAL PHALANX FRACTURE WITH PINNING;  Surgeon: Mack Hook, MD;  Location:  SURGERY CENTER;  Service: Orthopedics;  Laterality: Left;    There were no vitals filed for this visit.  Subjective Assessment - 07/21/18 1623    Subjective   Pt reports MD d/c her splint    Currently in Pain?  Yes    Pain Score  3     Pain Location  Finger (Comment which one)   Thumb   Pain Orientation  Left    Pain Descriptors / Indicators  Aching    Pain Type  Chronic pain    Pain Onset  More than a month ago    Pain Frequency  Intermittent    Aggravating Factors   movement    Pain Relieving Factors  inactivity    Multiple Pain Sites  No              Occupational Therapy Telehealth Visit:  I connected with Sweetie Bledsoe  today at 13:05 (time) by [employer] and verified that I am speaking with the correct person using two identifiers.  I discussed the limitations, risks, security and privacy concerns of performing an evaluation and management service by Webex and the availability of in person appointments.  I also discussed with the patient that there may be a patient responsible charge related to this service. The patient expressed understanding and agreed to proceed.    The patient's address was confirmed.  Identified to the patient that therapist is a licensed OT in the state of [place].  Verified phone # as [phone] to call in case of technical difficulties.  Treatment:Reviewed previously issued HEP, 10 reps each, progressed to pt performing P/ROM to DIP joint of LUE 2 sets of 10 reps min v.c Functional activities to increase LUE functional use and ROM: dealing playing cards with LUE, stacking and manipulating coins in hand, rotating a ball, crumpling and squeezing a bag in hand, min v.c for techniques.               OT Education - 07/21/18 1628    Education Details  ROM HEP for Lt  thumb reviewed and added P/ROM at DIP joint as well as functional activities for use of he hand    Person(s) Educated  Patient    Methods  Explanation;Demonstration;Handout    Comprehension  Verbalized understanding;Returned demonstration       OT Short Term Goals - 06/09/18 1009      OT SHORT TERM GOAL #1   Title  Pt wil lbe Mod I splinting use, care and precautions L hand/thumb    Baseline  Req Min A for don/doff and positioning    Time  3    Period  Weeks    Status  New    Target Date  06/30/18      OT SHORT TERM GOAL #2   Title  Pt will be Mod I home program L thumb    Baseline  Req Min VC's and TC's for performance and positioning    Time  3    Period  Weeks    Status  New    Target Date  06/30/18        OT Long Term Goals - 06/09/18 1011      OT LONG TERM GOAL #1   Title  Pt will be Mod I upgraded HEP L thumb     Baseline  depoendent    Time  6    Period  Weeks    Status  New    Target Date  07/21/18      OT LONG TERM GOAL #2   Title  Pt will be Mod I scar management L thumb    Baseline  Dependent    Time  6    Period  Weeks    Status  New    Target Date  07/21/18      OT LONG TERM GOAL #3   Title  Pt will demonstrate ability to perform opposition and fine motor tasks involving L dominant thumb (ie: Mod I writing/signing her nam, perofrm ADL involving tip to tip prehension at Mod I level)    Baseline  dependent    Time  6    Period  Weeks    Status  New    Target Date  07/21/18      OT LONG TERM GOAL #4   Title  Pt will demonstrate functional grip L hand of 10-15# or greater    Baseline  Unable    Time  6    Period  Weeks    Status  New    Target Date  07/21/18      OT LONG TERM GOAL #5   Title  Pt will demonstrate functional three point and lateral pinch for ADL activites (holding tooth brush, styling hair, holding utensils etc).    Baseline  Unable    Time  6    Period  Weeks    Status  New    Target Date  07/21/18            Plan - 07/21/18 1627    Clinical Impression Statement  Pt progressing with ROM however very stiff at IP joint of thumb. Pt was seen for a telehealth visit today.    Occupational performance deficits (Please refer to evaluation for details):  ADL's;IADL's;Work    Body Structure / Function / Physical Skills  ADL;Dexterity;Flexibility;ROM;Strength;Coordination;Edema;FMC;Scar mobility;Wound;Pain;UE functional use;Skin integrity    OT Frequency  1x / week    OT Duration  6 weeks    OT Treatment/Interventions  Self-care/ADL training;Paraffin;Therapeutic exercise;Ultrasound;Fluidtherapy;Scar mobilization;Splinting;Patient/family education;Therapeutic activities;Passive range  of motion;Moist Heat    Plan   P/ROM for IP joint, continue A/ROM and P/ROM at thumb    Consulted and Agree with Plan of Care  Patient       Patient will benefit from skilled  therapeutic intervention in order to improve the following deficits and impairments:  Body Structure / Function / Physical Skills  Visit Diagnosis: Pain in joint of left hand  Pain in left hand  Stiffness of left hand, not elsewhere classified  Muscle weakness (generalized)  Other lack of coordination    Problem List There are no active problems to display for this patient.   Danzel Marszalek 07/21/2018, 4:33 PM  [facility] [address] [place], [place], [address] Phone: [phone]   Fax:  [phone]  Name: Marionna Gonia MRN: [id number] Date of Birth: [birth year]

## 2018-07-28 ENCOUNTER — Ambulatory Visit: Payer: Medicaid Other | Admitting: Occupational Therapy

## 2018-07-28 ENCOUNTER — Other Ambulatory Visit: Payer: Self-pay

## 2018-07-28 DIAGNOSIS — M6281 Muscle weakness (generalized): Secondary | ICD-10-CM

## 2018-07-28 DIAGNOSIS — R278 Other lack of coordination: Secondary | ICD-10-CM

## 2018-07-28 DIAGNOSIS — M25642 Stiffness of left hand, not elsewhere classified: Secondary | ICD-10-CM

## 2018-07-28 DIAGNOSIS — M25542 Pain in joints of left hand: Secondary | ICD-10-CM

## 2018-07-28 DIAGNOSIS — M79642 Pain in left hand: Secondary | ICD-10-CM

## 2018-07-28 NOTE — Therapy (Addendum)
[facility] [address] [place], [place], [address] Phone: [phone]   Fax:  [phone]  Occupational Therapy Treatment  Patient Details  Name: Melissa Andrade MRN: [id number] Date of Birth: 12-03-1995 Referring Provider (OT): Dr. Janee Morn   Encounter Date: 07/28/2018  OT End of Session - 07/28/18 1623    Visit Number  4    Number of Visits  13    Date for OT Re-Evaluation  09/01/18    Authorization Type  MCD approved 7 visits from 3/18 - 07/08/18 , approved 6 visits  4/16-5/27/20    Authorization - Visit Number  3    Authorization - Number of Visits  7    OT Start Time  1007    OT Stop Time  1050    OT Time Calculation (min)  43 min    Activity Tolerance  Patient tolerated treatment well    Behavior During Therapy  [facility] for tasks assessed/performed       Past Medical History:  Diagnosis Date  . Asthma   . Environmental allergies   . Sickle cell trait [facility])     Past Surgical History:  Procedure Laterality Date  . FRACTURE SURGERY    . OPEN REDUCTION INTERNAL FIXATION (ORIF) DISTAL PHALANX Left 04/27/2018   Procedure: OPEN TREATMENT OF LEFT THUMB DISTAL PHALANX FRACTURE WITH PINNING;  Surgeon: Mack Hook, MD;  Location: [facility];  Service: Orthopedics;  Laterality: Left;    There were no vitals filed for this visit.  Subjective Assessment - 07/28/18 1624    Pertinent History  Lt thumb distal phalanx fx s/p ORIF 04/27/18. PMH: Asthma    Currently in Pain?  Yes    Pain Score  3     Pain Location  --   thumb   Pain Orientation  Left    Pain Descriptors / Indicators  Aching    Pain Type  Chronic pain    Pain Onset  More than a month ago    Pain Frequency  Intermittent    Aggravating Factors   movement    Pain Relieving Factors  inactivity    Multiple Pain Sites  No                Occupational Therapy Telehealth Visit:  I connected with Melissa Andrade  today at 10:07  (time) by [employer] and verified that I am speaking with the correct person using two identifiers.  I discussed the limitations, risks, security and privacy concerns of performing an evaluation and management service by Webex and the availability of in person appointments.  I also discussed with the patient that there may be a patient responsible charge related to this service. The patient expressed understanding and agreed to proceed.    The patient's address was confirmed.  Identified to the patient that therapist is a licensed OT in the state of [place].  Verified phone # as [phone] to call in case of technical difficulties.  Treatment:Reviewed previously issued A/ROM  HEP, 10 reps each, progressed to pt performing P/ROM to DIP joint of LUE 2 sets of 10 reps min v.c Pt performed finger thumb opposition, 10 reps each, min v.c Functional activities to increase LUE functional use and ROM: dealing playing cards with LUE,  Manipulating coins and Pinching  and squeezing slime in hand, min v.c for techniques. Pt requested to come into the clinic next visit. Pt is progressing slowly due to decreased carryover at home.  OT Education - 07/28/18 1622    Education Details  importance of exercising multiple times per day to increase ROM.    Person(s) Educated  Patient    Methods  Explanation;Demonstration;Handout    Comprehension  Verbalized understanding;Returned demonstration       OT Short Term Goals - 06/09/18 1009      OT SHORT TERM GOAL #1   Title  Pt wil lbe Mod I splinting use, care and precautions L hand/thumb    Baseline  Req Min A for don/doff and positioning    Time  3    Period  Weeks    Status  New    Target Date  06/30/18      OT SHORT TERM GOAL #2   Title  Pt will be Mod I home program L thumb    Baseline  Req Min VC's and TC's for performance and positioning    Time  3    Period  Weeks    Status  New    Target Date  06/30/18        OT  Long Term Goals - 06/09/18 1011      OT LONG TERM GOAL #1   Title  Pt will be Mod I upgraded HEP L thumb    Baseline  depoendent    Time  6    Period  Weeks    Status  New    Target Date  07/21/18      OT LONG TERM GOAL #2   Title  Pt will be Mod I scar management L thumb    Baseline  Dependent    Time  6    Period  Weeks    Status  New    Target Date  07/21/18      OT LONG TERM GOAL #3   Title  Pt will demonstrate ability to perform opposition and fine motor tasks involving L dominant thumb (ie: Mod I writing/signing her nam, perofrm ADL involving tip to tip prehension at Mod I level)    Baseline  dependent    Time  6    Period  Weeks    Status  New    Target Date  07/21/18      OT LONG TERM GOAL #4   Title  Pt will demonstrate functional grip L hand of 10-15# or greater    Baseline  Unable    Time  6    Period  Weeks    Status  New    Target Date  07/21/18      OT LONG TERM GOAL #5   Title  Pt will demonstrate functional three point and lateral pinch for ADL activites (holding tooth brush, styling hair, holding utensils etc).    Baseline  Unable    Time  6    Period  Weeks    Status  New    Target Date  07/21/18            Plan - 07/28/18 1625    Clinical Impression Statement  Pt progressing with ROM however very stiff at IP joint of thumb. Pt reports only exercising approximately 1x per day.Pt was seen for a telehealth visit today.    OT Occupational Profile and History  Problem Focused Assessment - Including review of records relating to presenting problem    Occupational performance deficits (Please refer to evaluation for details):  ADL's;IADL's;Work    Body Structure / Function / Physical Skills  ADL;Dexterity;Flexibility;ROM;Strength;Coordination;Edema;FMC;Scar mobility;Wound;Pain;UE functional use;Skin  integrity    Rehab Potential  Good    Clinical Decision Making  Several treatment options, min-mod task modification necessary    Comorbidities  Affecting Occupational Performance:  None    OT Frequency  1x / week    OT Duration  6 weeks    OT Treatment/Interventions  Self-care/ADL training;Paraffin;Therapeutic exercise;Ultrasound;Fluidtherapy;Scar mobilization;Splinting;Patient/family education;Therapeutic activities;Passive range of motion;Moist Heat    Plan   P/ROM for IP joint, continue A/ROM and P/ROM at thumb    Consulted and Agree with Plan of Care  Patient       Patient will benefit from skilled therapeutic intervention in order to improve the following deficits and impairments:  Body Structure / Function / Physical Skills  Visit Diagnosis: No diagnosis found.    Problem List There are no active problems to display for this patient.   RINE,KATHRYN 07/28/2018, 4:26 PM  [facility] [address] [place], [place], [address] Phone: [phone]   Fax:  [phone]  Name: Melissa Andrade MRN: [id number] Date of Birth: January 12, 1996

## 2018-08-05 ENCOUNTER — Ambulatory Visit: Payer: Medicaid Other | Attending: Orthopedic Surgery | Admitting: Occupational Therapy

## 2018-08-05 ENCOUNTER — Other Ambulatory Visit: Payer: Self-pay

## 2018-08-05 DIAGNOSIS — M6281 Muscle weakness (generalized): Secondary | ICD-10-CM | POA: Diagnosis present

## 2018-08-05 DIAGNOSIS — M25642 Stiffness of left hand, not elsewhere classified: Secondary | ICD-10-CM

## 2018-08-05 DIAGNOSIS — R278 Other lack of coordination: Secondary | ICD-10-CM

## 2018-08-05 DIAGNOSIS — M25542 Pain in joints of left hand: Secondary | ICD-10-CM

## 2018-08-05 DIAGNOSIS — M79642 Pain in left hand: Secondary | ICD-10-CM

## 2018-08-05 NOTE — Therapy (Signed)
[facility] [address] [place], [place], [address] Phone: [phone]   Fax:  [phone]  Occupational Therapy Treatment  Patient Details  Name: Melissa Andrade MRN: [id number] Date of Birth: [birth year] Referring Provider (OT): Dr. Janee Morn   Encounter Date: 08/05/2018  OT End of Session - 08/05/18 1337    Visit Number  5    Number of Visits  13    Date for OT Re-Evaluation  09/01/18    Authorization Type  MCD approved 7 visits from 3/18 - 07/08/18 , approved 6 visits  4/16-5/27/20    Authorization - Visit Number  4    Authorization - Number of Visits  7    OT Start Time  1318    OT Stop Time  1400    OT Time Calculation (min)  42 min    Activity Tolerance  Patient tolerated treatment well    Behavior During Therapy  [facility] for tasks assessed/performed       Past Medical History:  Diagnosis Date  . Asthma   . Environmental allergies   . Sickle cell trait [facility])     Past Surgical History:  Procedure Laterality Date  . FRACTURE SURGERY    . OPEN REDUCTION INTERNAL FIXATION (ORIF) DISTAL PHALANX Left 04/27/2018   Procedure: OPEN TREATMENT OF LEFT THUMB DISTAL PHALANX FRACTURE WITH PINNING;  Surgeon: Mack Hook, MD;  Location: [facility];  Service: Orthopedics;  Laterality: Left;    There were no vitals filed for this visit.  Subjective Assessment - 08/05/18 1322    Subjective   Pt states MD said her thumb is improving    Pertinent History  Lt thumb distal phalanx fx s/p ORIF 04/27/18. PMH: Asthma    Limitations  splint on b/t exercises, no strengthening yet    Currently in Pain?  Yes    Pain Score  2     Pain Location  --   thumb   Pain Orientation  Left    Pain Descriptors / Indicators  Aching    Pain Type  Chronic pain    Pain Onset  More than a month ago    Pain Frequency  Intermittent    Aggravating Factors   movement    Pain Relieving Factors  inactivity    Multiple Pain Sites  No             Treatment: Pt was seen in the clinic today with universal masking of staff and patients with reduced number of patients in the clinic due to COVID-19. Fluidotherapy x 10 mins to LUE, for pain and stiffness with pt performing a self ROM while in fluido.no adverse reactions Reviewed A/ROM finger/ thumb opposition, thumb MP and IP flexion followed by gentle P/ROM to IP joint of 5th digit. Pt was issued Red theraputty and instructed in HEP.Pt returned demonstration. Gripper set at level 2 to pick up 1 inch blocks for sustained gip, min difficulty.                OT Education - 08/05/18 1604    Education Details  scar massage, putty HEP.    Person(s) Educated  Patient    Methods  Explanation;Demonstration;Handout    Comprehension  Verbalized understanding;Returned demonstration       OT Short Term Goals - 08/05/18 1339      OT SHORT TERM GOAL #1   Title  Pt wil lbe Mod I splinting use, care and precautions L hand/thumb  Status  Achieved      OT SHORT TERM GOAL #2   Title  Pt will be Mod I home program L thumb    Status  Achieved        OT Long Term Goals - 08/05/18 1339      OT LONG TERM GOAL #1   Title  Pt will be Mod I upgraded HEP L thumb    Status  On-going      OT LONG TERM GOAL #2   Title  Pt will be Mod I scar management L thumb    Status  On-going      OT LONG TERM GOAL #3   Title  Pt will demonstrate ability to perform opposition and fine motor tasks involving L dominant thumb (ie: Mod I writing/signing her nam, perofrm ADL involving tip to tip prehension at Mod I level)    Status  On-going      OT LONG TERM GOAL #4   Title  Pt will demonstrate functional grip L hand of 10-15# or greater    Status  Achieved   [id number]#, RUE 78#     OT LONG TERM GOAL #5   Title  Pt will demonstrate functional three point and lateral pinch for ADL activites (holding tooth brush, styling hair, holding utensils etc).    Status  On-going             Plan - 08/05/18 1602    Clinical Impression Statement  Pt is progressing towards goals with improving DIP flexion of thumb joint.    Occupational performance deficits (Please refer to evaluation for details):  ADL's;IADL's;Work    Body Structure / Function / Physical Skills  ADL;Dexterity;Flexibility;ROM;Strength;Coordination;Edema;FMC;Scar mobility;Wound;Pain;UE functional use;Skin integrity    Rehab Potential  Good    OT Frequency  1x / week    OT Duration  6 weeks    OT Treatment/Interventions  Self-care/ADL training;Paraffin;Therapeutic exercise;Ultrasound;Fluidtherapy;Scar mobilization;Splinting;Patient/family education;Therapeutic activities;Passive range of motion;Moist Heat    Plan   P/ROM for IP joint, continue A/ROM and P/ROM at thumb, continue strengthening    Consulted and Agree with Plan of Care  Patient       Patient will benefit from skilled therapeutic intervention in order to improve the following deficits and impairments:  Body Structure / Function / Physical Skills  Visit Diagnosis: Pain in joint of left hand  Pain in left hand  Stiffness of left hand, not elsewhere classified  Muscle weakness (generalized)  Other lack of coordination    Problem List There are no active problems to display for this patient.   RINE,KATHRYN 08/05/2018, 4:08 PM  [place] [facility] [address] [place], [place], [address] Phone: [phone]   Fax:  [phone]  Name: Melissa Andrade MRN: [id number] Date of Birth: 03/17/1996

## 2018-08-05 NOTE — Patient Instructions (Signed)
1. Grip Strengthening (Resistive Putty)   Squeeze putty using thumb and all fingers. Repeat _20___ times. Do __2__ sessions per day.   2. Roll putty into tube on table and pinch between each finger and thumb x 10 reps each. (can do ring and small finger together)   3. Hold the putty in your hand press tip of thumb down into putty 10-15 reps 1-2 x day.   Copyright  VHI. All rights reserved.

## 2018-08-12 ENCOUNTER — Other Ambulatory Visit: Payer: Self-pay

## 2018-08-12 ENCOUNTER — Ambulatory Visit: Payer: Medicaid Other | Admitting: Occupational Therapy

## 2018-08-12 DIAGNOSIS — M25542 Pain in joints of left hand: Secondary | ICD-10-CM | POA: Diagnosis not present

## 2018-08-12 DIAGNOSIS — M79642 Pain in left hand: Secondary | ICD-10-CM

## 2018-08-12 DIAGNOSIS — M25642 Stiffness of left hand, not elsewhere classified: Secondary | ICD-10-CM

## 2018-08-12 DIAGNOSIS — M6281 Muscle weakness (generalized): Secondary | ICD-10-CM

## 2018-08-12 NOTE — Therapy (Signed)
[facility] [address] [place], [place], [address] Phone: [phone]   Fax:  [phone]  Occupational Therapy Treatment  Patient Details  Name: Melissa Andrade MRN: [id number] Date of Birth: 23-Jan-1996 Referring Provider (OT): Dr. Janee Morn   Encounter Date: 08/12/2018  OT End of Session - 08/12/18 1324    Visit Number  5    Number of Visits  13    Date for OT Re-Evaluation  09/01/18    Authorization Type  MCD approved 7 visits from 3/18 - 07/08/18 , approved 6 visits  4/16-5/27/20    Authorization - Visit Number  5    Authorization - Number of Visits  7    OT Start Time  1314    OT Stop Time  1355    OT Time Calculation (min)  41 min    Activity Tolerance  Patient tolerated treatment well    Behavior During Therapy  [facility] for tasks assessed/performed       Past Medical History:  Diagnosis Date  . Asthma   . Environmental allergies   . Sickle cell trait [facility])     Past Surgical History:  Procedure Laterality Date  . FRACTURE SURGERY    . OPEN REDUCTION INTERNAL FIXATION (ORIF) DISTAL PHALANX Left 04/27/2018   Procedure: OPEN TREATMENT OF LEFT THUMB DISTAL PHALANX FRACTURE WITH PINNING;  Surgeon: Mack Hook, MD;  Location: [facility];  Service: Orthopedics;  Laterality: Left;    There were no vitals filed for this visit.  Subjective Assessment - 08/12/18 1323    Subjective   Pt reports she hasn't been using her putty as she went out of town    Pertinent History  Lt thumb distal phalanx fx s/p ORIF 04/27/18. PMH: Asthma    Limitations  splint on b/t exercises, no strengthening yet    Currently in Pain?  Yes    Pain Score  2     Pain Location  --   thumb    Pain Orientation  Left    Pain Descriptors / Indicators  Aching    Pain Type  Chronic pain    Pain Onset  More than a month ago    Pain Frequency  Intermittent    Aggravating Factors   movement    Pain Relieving Factors  activity     Multiple Pain Sites  No           Treatment:Treatment: Pt was seen in the clinic today with universal masking of staff and patients with reduced number of patients in the clinic due to COVID-19. Fluidotherapy x 9 mins to LUE, for pain and stiffness with pt performing a self ROM while in fluido.no adverse reactions Reviewed A/ROM finger/ thumb opposition, and IP flexion followed by P/ROM to IP joint of 5th digit. Reviewed Red theraputty exercises from HEP, min v.c .Pt returned demonstration. Graded clothespins for sustained pinch, 1-8 lbs resistance for tip and 3 pt pinch. Gripper set at level 2 to pick up 1 inch blocks for sustained grip, min difficulty.                    OT Short Term Goals - 08/05/18 1339      OT SHORT TERM GOAL #1   Title  Pt wil lbe Mod I splinting use, care and precautions L hand/thumb    Status  Achieved      OT SHORT TERM GOAL #2   Title  Pt will be Mod  I home program L thumb    Status  Achieved        OT Long Term Goals - 08/05/18 1339      OT LONG TERM GOAL #1   Title  Pt will be Mod I upgraded HEP L thumb    Status  On-going      OT LONG TERM GOAL #2   Title  Pt will be Mod I scar management L thumb    Status  On-going      OT LONG TERM GOAL #3   Title  Pt will demonstrate ability to perform opposition and fine motor tasks involving L dominant thumb (ie: Mod I writing/signing her nam, perofrm ADL involving tip to tip prehension at Mod I level)    Status  On-going      OT LONG TERM GOAL #4   Title  Pt will demonstrate functional grip L hand of 10-15# or greater    Status  Achieved   [id number]#, RUE 78#     OT LONG TERM GOAL #5   Title  Pt will demonstrate functional three point and lateral pinch for ADL activites (holding tooth brush, styling hair, holding utensils etc).    Status  On-going            Plan - 08/12/18 1325    Clinical Impression Statement  Pt is progressing towards goals. She demonstrates improving  strength and ROM, however she remains limited by pain.       Patient will benefit from skilled therapeutic intervention in order to improve the following deficits and impairments:     Visit Diagnosis: Pain in joint of left hand  Pain in left hand  Stiffness of left hand, not elsewhere classified  Muscle weakness (generalized)    Problem List There are no active problems to display for this patient.   Tonda Wiederhold 08/12/2018, 1:47 PM  [facility] [address] [place], [place], [address] Phone: [phone]   Fax:  [phone]  Name: Melissa Andrade MRN: [id number] Date of Birth: 10/13/1995

## 2018-08-19 ENCOUNTER — Other Ambulatory Visit: Payer: Self-pay

## 2018-08-19 ENCOUNTER — Ambulatory Visit: Payer: Medicaid Other | Admitting: Occupational Therapy

## 2018-08-19 DIAGNOSIS — M25542 Pain in joints of left hand: Secondary | ICD-10-CM | POA: Diagnosis not present

## 2018-08-19 DIAGNOSIS — M6281 Muscle weakness (generalized): Secondary | ICD-10-CM

## 2018-08-19 DIAGNOSIS — R278 Other lack of coordination: Secondary | ICD-10-CM

## 2018-08-19 DIAGNOSIS — M79642 Pain in left hand: Secondary | ICD-10-CM

## 2018-08-19 DIAGNOSIS — M25642 Stiffness of left hand, not elsewhere classified: Secondary | ICD-10-CM

## 2018-08-19 NOTE — Therapy (Signed)
[facility] [address] [place], [place], [address] Phone: [phone]   Fax:  [phone]  Occupational Therapy Treatment  Patient Details  Name: Melissa Andrade MRN: [id number] Date of Birth: 1996/02/10 Referring Provider (OT): Dr. Janee Morn   Encounter Date: 08/19/2018  OT End of Session - 08/19/18 1558    Visit Number  6    Number of Visits  13    Authorization - Visit Number  6    Authorization - Number of Visits  7    OT Start Time  1535    OT Stop Time  1615    OT Time Calculation (min)  40 min    Activity Tolerance  Patient tolerated treatment well    Behavior During Therapy  [facility] for tasks assessed/performed       Past Medical History:  Diagnosis Date  . Asthma   . Environmental allergies   . Sickle cell trait [facility])     Past Surgical History:  Procedure Laterality Date  . FRACTURE SURGERY    . OPEN REDUCTION INTERNAL FIXATION (ORIF) DISTAL PHALANX Left 04/27/2018   Procedure: OPEN TREATMENT OF LEFT THUMB DISTAL PHALANX FRACTURE WITH PINNING;  Surgeon: Mack Hook, MD;  Location: [facility];  Service: Orthopedics;  Laterality: Left;    There were no vitals filed for this visit.  Subjective Assessment - 08/19/18 1609    Subjective   Pt reports exercising alot this last week    Pertinent History  Lt thumb distal phalanx fx s/p ORIF 04/27/18. PMH: Asthma    Currently in Pain?  No/denies             Treatment: Pt was seen in the clinic today with universal masking of staff and patients with reduced number of patients in the clinic due to COVID-19. Fluidotherapy x 12 mins to LUE, for pain and stiffness with pt performing a self ROM while in fluido.no adverse reactions Reviewed A/ROM finger/ thumb opposition, and IP flexion to IP joint of 5th digit. P/ROM to IP joint of thumb Reviewed Red theraputty exercises from HEP, .Pt returned demonstration. Graded clothespins for sustained  pinch, 1-8 lbs resistance for tip and 3 pt pinch. Gripper set at level 3 to pick up 1 inch blocks for sustained grip, min difficulty. Pt performed braiding task successfully in prep for braiding her own hair.                 OT Short Term Goals - 08/05/18 1339      OT SHORT TERM GOAL #1   Title  Pt wil lbe Mod I splinting use, care and precautions L hand/thumb    Status  Achieved      OT SHORT TERM GOAL #2   Title  Pt will be Mod I home program L thumb    Status  Achieved        OT Long Term Goals - 08/19/18 1607      OT LONG TERM GOAL #1   Title  Pt will be Mod I upgraded HEP L thumb    Status  On-going      OT LONG TERM GOAL #2   Title  Pt will be Mod I scar management L thumb    Status  On-going      OT LONG TERM GOAL #3   Title  Pt will demonstrate ability to perform opposition and fine motor tasks involving L dominant thumb (ie: Mod I writing/signing her nam, perofrm ADL  involving tip to tip prehension at Mod I level)    Status  On-going      OT LONG TERM GOAL #4   Title  Pt will demonstrate functional grip L hand of 10-15# or greater    Status  Achieved   LUE90#, RUE 78#,      OT LONG TERM GOAL #5   Title  Pt will demonstrate functional three point and lateral pinch for ADL activites (holding tooth brush, styling hair, holding utensils etc).    Status  On-going            Plan - 08/19/18 1610    Clinical Impression Statement  Pt is progressing towards goals. She demonstrates improving strength and ROM. Her pain has decreased overall. Anticipate d/c next visit.    Occupational performance deficits (Please refer to evaluation for details):  ADL's;IADL's;Work    Body Structure / Function / Physical Skills  ADL;Dexterity;Flexibility;ROM;Strength;Coordination;Edema;FMC;Scar mobility;Wound;Pain;UE functional use;Skin integrity    Rehab Potential  Good    OT Frequency  1x / week    OT Duration  6 weeks    OT Treatment/Interventions  Self-care/ADL  training;Paraffin;Therapeutic exercise;Ultrasound;Fluidtherapy;Scar mobilization;Splinting;Patient/family education;Therapeutic activities;Passive range of motion;Moist Heat    Plan   P/ROM for IP joint, continue A/ROM and P/ROM at thumb, continue strengthening- d/c next visit    Consulted and Agree with Plan of Care  Patient       Patient will benefit from skilled therapeutic intervention in order to improve the following deficits and impairments:  Body Structure / Function / Physical Skills  Visit Diagnosis: Pain in joint of left hand  Pain in left hand  Stiffness of left hand, not elsewhere classified  Muscle weakness (generalized)  Other lack of coordination    Problem List There are no active problems to display for this patient.   Melissa Andrade 08/19/2018, 4:12 PM  [facility] [address] [place], [place], [address] Phone: [phone]   Fax:  [phone]  Name: Melissa Andrade MRN: [id number] Date of Birth: 05/03/1995

## 2018-08-26 ENCOUNTER — Encounter: Payer: Self-pay | Admitting: Occupational Therapy

## 2018-08-26 ENCOUNTER — Ambulatory Visit: Payer: Medicaid Other | Admitting: Occupational Therapy

## 2018-08-26 ENCOUNTER — Other Ambulatory Visit: Payer: Self-pay

## 2018-08-26 DIAGNOSIS — M25542 Pain in joints of left hand: Secondary | ICD-10-CM

## 2018-08-26 DIAGNOSIS — M25642 Stiffness of left hand, not elsewhere classified: Secondary | ICD-10-CM

## 2018-08-26 DIAGNOSIS — M79642 Pain in left hand: Secondary | ICD-10-CM

## 2018-08-26 DIAGNOSIS — M6281 Muscle weakness (generalized): Secondary | ICD-10-CM

## 2018-08-26 DIAGNOSIS — R278 Other lack of coordination: Secondary | ICD-10-CM

## 2018-08-26 NOTE — Therapy (Signed)
[facility] [address] [place], [place], [address] Phone: [phone]   Fax:  [phone]  Occupational Therapy Treatment  Patient Details  Name: Melissa Andrade MRN: [id number] Date of Birth: Oct 22, 1995 Referring Provider (OT): Dr. Grand[place] Silos   Encounter Date: 08/26/2018  OT End of Session - 08/26/18 1718    Visit Number  7   eval plus 6 visits   Number of Visits  13    Date for OT Re-Evaluation  09/01/18    Authorization Type  MCD approved 7 visits from 3/18 - 07/08/18 , approved 6 visits  4/16-5/27/20    Authorization - Visit Number  7    Authorization - Number of Visits  7    OT Start Time  8250    OT Stop Time  5397    OT Time Calculation (min)  41 min    Activity Tolerance  Patient tolerated treatment well       Past Medical History:  Diagnosis Date  . Asthma   . Environmental allergies   . Sickle cell trait [facility])     Past Surgical History:  Procedure Laterality Date  . FRACTURE SURGERY    . OPEN REDUCTION INTERNAL FIXATION (ORIF) DISTAL PHALANX Left 04/27/2018   Procedure: OPEN TREATMENT OF LEFT THUMB DISTAL PHALANX FRACTURE WITH PINNING;  Surgeon: Milly Jakob, MD;  Location: [facility];  Service: Orthopedics;  Laterality: Left;    There were no vitals filed for this visit.  Subjective Assessment - 08/26/18 1423    Subjective   I can use my left hand but often use my right because I am faster    Pertinent History  Lt thumb distal phalanx fx s/p ORIF 04/27/18. PMH: Asthma    Limitations  splint on b/t exercises, no strengthening yet    Currently in Pain?  Yes   with HEP   Pain Score  4     Pain Location  Finger (Comment which one)   thumb   Pain Orientation  Left    Pain Type  Chronic pain    Pain Onset  More than a month ago    Pain Frequency  Intermittent    Aggravating Factors   only with HEP    Pain Relieving Factors  none                   OT  Treatments/Exercises (OP) - 08/26/18 0001      ADLs   Writing  Addressed writing with pt - pt able to write 3 sentence paragraph with fatigue of 3/10.  Issued writing as part of HEP and pt verbalized understanding.      ADL Comments  Assessed LTG's - see goals for updates.  IP flexion AROM for thumb is 40* of flexion with 50* with AAROM for flexion.        Hand Exercises   Other Hand Exercises  Self ROM for IP flexion to IP joint of 5th digit followed by AAROM to IP joint.  Practiced using L hand to pick small objects using 2 pt and 3pt pinch. Pt able to complete without difficulty.  Reinforced need for pt to use L hand as much as possible as this is her dominant hand.        Modalities   Modalities  Fluidotherapy      LUE Fluidotherapy   Number Minutes Fluidotherapy  12 Minutes    LUE Fluidotherapy Location  Hand    Comments  to  address stiffness and pain in L IP joint of thumb prior to exercise and functional use               OT Short Term Goals - 08/26/18 1717      OT SHORT TERM GOAL #1   Title  Pt wil lbe Mod I splinting use, care and precautions L hand/thumb    Status  Achieved      OT SHORT TERM GOAL #2   Title  Pt will be Mod I home program L thumb    Status  Achieved        OT Long Term Goals - 08/26/18 1717      OT LONG TERM GOAL #1   Title  Pt will be Mod I upgraded HEP L thumb    Status  Achieved      OT LONG TERM GOAL #2   Title  Pt will be Mod I scar management L thumb    Status  Achieved      OT LONG TERM GOAL #3   Title  Pt will demonstrate ability to perform opposition and fine motor tasks involving L dominant thumb (ie: Mod I writing/signing her nam, perofrm ADL involving tip to tip prehension at Mod I level)    Status  Achieved      OT LONG TERM GOAL #4   Title  Pt will demonstrate functional grip L hand of 10-15# or greater    Status  Achieved   LUE90#, RUE 78#,      OT LONG TERM GOAL #5   Title  Pt will demonstrate functional three  point and lateral pinch for ADL activites (holding tooth brush, styling hair, holding utensils etc).    Status  Achieved            Plan - 08/26/18 1717    Clinical Impression Statement  Pt has met all goals and is ready for discharge. Pt in agreement.     OT Occupational Profile and History  Problem Focused Assessment - Including review of records relating to presenting problem    Occupational performance deficits (Please refer to evaluation for details):  ADL's;IADL's;Work    Body Structure / Function / Physical Skills  ADL;Dexterity;Flexibility;ROM;Strength;Coordination;Edema;FMC;Scar mobility;Wound;Pain;UE functional use;Skin integrity    Rehab Potential  Good    Clinical Decision Making  Several treatment options, min-mod task modification necessary    Comorbidities Affecting Occupational Performance:  None    Modification or Assistance to Complete Evaluation   No modification of tasks or assist necessary to complete eval    OT Frequency  1x / week    OT Duration  6 weeks    OT Treatment/Interventions  Self-care/ADL training;Paraffin;Therapeutic exercise;Ultrasound;Fluidtherapy;Scar mobilization;Splinting;Patient/family education;Therapeutic activities;Passive range of motion;Moist Heat    Plan  d/c from OT    Consulted and Agree with Plan of Care  Patient       Patient will benefit from skilled therapeutic intervention in order to improve the following deficits and impairments:  Body Structure / Function / Physical Skills  Visit Diagnosis: Pain in joint of left hand  Pain in left hand  Stiffness of left hand, not elsewhere classified  Muscle weakness (generalized)  Other lack of coordination    Problem List There are no active problems to display for this patient. OCCUPATIONAL THERAPY DISCHARGE SUMMARY  Visits from Start of Care: 7  Current functional level related to goals / functional outcomes: See above   Remaining deficits: Reduced ROM of L IP thumb  joint   Education / Equipment: HEP  Plan: Patient agrees to discharge.  Patient goals were met. Patient is being discharged due to meeting the stated rehab goals.  ?????      Quay Burow, OTR/L 08/26/2018, 5:20 PM  [facility] [address] [place], [place], [address] Phone: [phone]   Fax:  [phone]  Name: Melissa Andrade MRN: [id number] Date of Birth: 11-Aug-1995

## 2019-09-20 IMAGING — RF DG C-ARM 61-120 MIN
1 series · 2 of 2 positions shown · non-contrast
Comparison: None.

CLINICAL DATA: Open treatment.

EXAM:
LEFT THUMB 2+V; DG C-ARM 61-120 MIN

[Series 1: run · 2 of 2 slices shown]
[im 1/2]
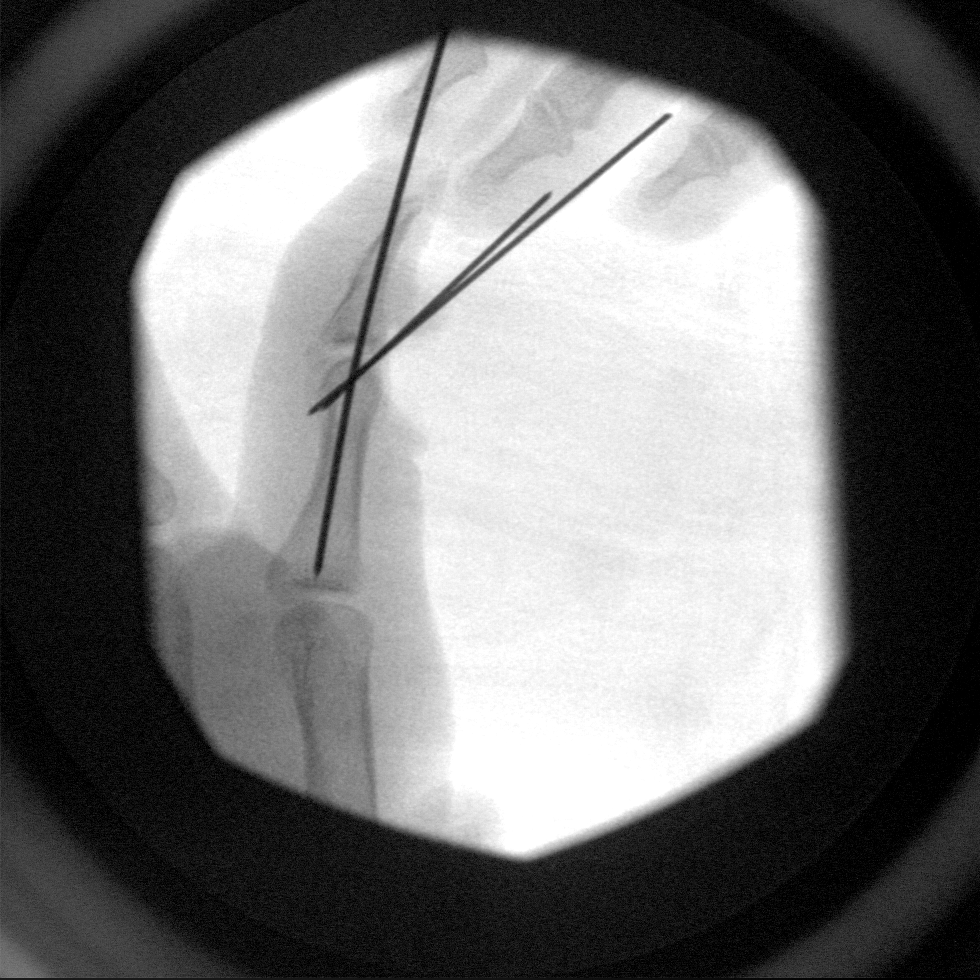
[im 2/2]
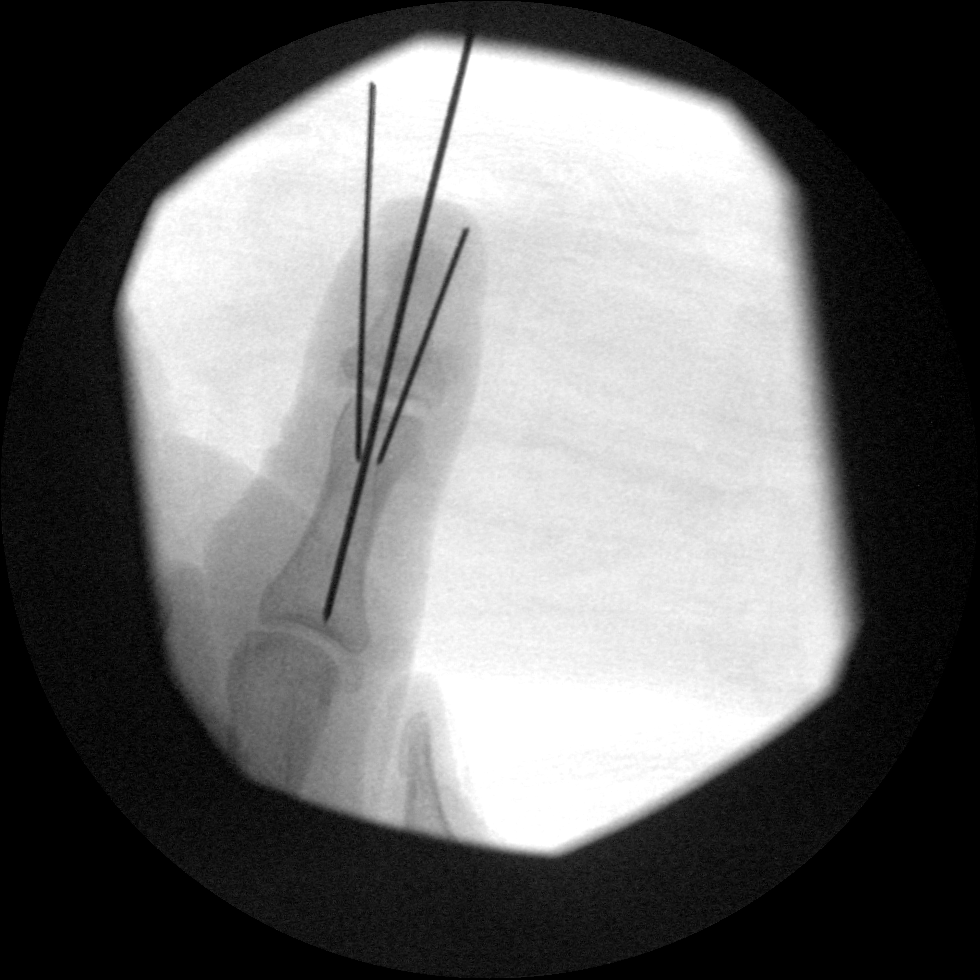

[2 of 2 positions shown; findings below may reference images not displayed]

FINDINGS: Two intraoperative spot images demonstrate placement of wires across
the left thumb IP joint. Anatomic alignment.
IMPRESSION: As above.
